# Patient Record
Sex: Male | Born: 1980 | Race: Black or African American | Hispanic: No | Marital: Single | Smoking: Current every day smoker
Health system: Southern US, Community
[De-identification: ages and names within clinical notes are randomized; demographics above are authoritative.]

## PROBLEM LIST (undated history)

## (undated) DIAGNOSIS — J309 Allergic rhinitis, unspecified: Secondary | ICD-10-CM

## (undated) HISTORY — DX: Allergic rhinitis, unspecified: J30.9

---

## 2004-08-04 ENCOUNTER — Encounter: Admission: RE | Admit: 2004-08-04 | Discharge: 2004-08-04 | Payer: Self-pay | Admitting: Family Medicine

## 2005-12-17 ENCOUNTER — Emergency Department: Payer: Self-pay | Admitting: Emergency Medicine

## 2007-05-02 ENCOUNTER — Emergency Department (HOSPITAL_COMMUNITY): Admission: EM | Admit: 2007-05-02 | Discharge: 2007-05-02 | Payer: Self-pay | Admitting: Emergency Medicine

## 2007-09-07 ENCOUNTER — Ambulatory Visit: Payer: Self-pay | Admitting: General Practice

## 2008-05-08 ENCOUNTER — Emergency Department (HOSPITAL_COMMUNITY): Admission: EM | Admit: 2008-05-08 | Discharge: 2008-05-08 | Payer: Self-pay | Admitting: Emergency Medicine

## 2010-04-12 HISTORY — PX: MINOR FULGERATION OF ANAL CONDYLOMA: SHX6467

## 2013-09-28 ENCOUNTER — Ambulatory Visit (INDEPENDENT_AMBULATORY_CARE_PROVIDER_SITE_OTHER): Payer: BC Managed Care – PPO | Admitting: Medical

## 2013-09-28 ENCOUNTER — Ambulatory Visit
Admission: RE | Admit: 2013-09-28 | Discharge: 2013-09-28 | Disposition: A | Discharge status: Home / Self Care | Payer: BC Managed Care – PPO | Source: Ambulatory Visit | Attending: Medical | Admitting: Medical

## 2013-09-28 ENCOUNTER — Encounter: Payer: Self-pay | Admitting: Medical

## 2013-09-28 VITALS — BP 110/80 | HR 68 | Temp 97.9°F | Resp 16 | Ht 69.0 in | Wt 175.0 lb

## 2013-09-28 DIAGNOSIS — B353 Tinea pedis: Secondary | ICD-10-CM

## 2013-09-28 DIAGNOSIS — L0889 Other specified local infections of the skin and subcutaneous tissue: Secondary | ICD-10-CM

## 2013-09-28 DIAGNOSIS — B351 Tinea unguium: Secondary | ICD-10-CM

## 2013-09-28 DIAGNOSIS — M79672 Pain in left foot: Secondary | ICD-10-CM

## 2013-09-28 DIAGNOSIS — M79609 Pain in unspecified limb: Secondary | ICD-10-CM

## 2013-09-28 DIAGNOSIS — M79671 Pain in right foot: Secondary | ICD-10-CM

## 2013-09-28 LAB — CBC WITH DIFFERENTIAL/PLATELET
Basophils Absolute: 0 10*3/uL (ref 0.0–0.1)
Eosinophils Absolute: 0 10*3/uL (ref 0.0–0.7)
HEMATOCRIT: 40 % (ref 39.0–52.0)
Hemoglobin: 13.4 g/dL (ref 13.0–17.0)
Lymphocytes Relative: 24 % (ref 12–46)
Lymphs Abs: 1.3 10*3/uL (ref 0.7–4.0)
MCH: 30.7 pg (ref 26.0–34.0)
MCHC: 33.5 g/dL (ref 30.0–36.0)
MCV: 91.7 fL (ref 78.0–100.0)
Monocytes Absolute: 0.3 10*3/uL (ref 0.1–1.0)
Monocytes Relative: 6 % (ref 3–12)
NEUTROS PCT: 70 % (ref 43–77)
Neutro Abs: 3.9 10*3/uL (ref 1.7–7.7)
PLATELETS: 199 10*3/uL (ref 150–400)
RBC: 4.36 MIL/uL (ref 4.22–5.81)
RDW: 12.9 % (ref 11.5–15.5)
WBC: 5.6 10*3/uL (ref 4.0–10.5)

## 2013-09-28 LAB — POCT CBG (FASTING - GLUCOSE)-MANUAL ENTRY: GLUCOSE FASTING, POC: 95 mg/dL (ref 70–99)

## 2013-09-28 MED ORDER — HYDROCODONE-ACETAMINOPHEN 5-325 MG PO TABS: 1.0000 | ORAL_TABLET | Freq: Four times a day (QID) | ORAL | Status: DC | PRN

## 2013-09-28 MED ORDER — CEFTRIAXONE SODIUM 1 G IJ SOLR
1.0000 g | Freq: Once | INTRAMUSCULAR | Status: AC
  Administered 2013-09-28: 1 g via INTRAMUSCULAR

## 2013-09-28 MED ORDER — CEPHALEXIN 500 MG PO CAPS: 500.0000 mg | ORAL_CAPSULE | Freq: Four times a day (QID) | ORAL | Status: DC

## 2013-09-28 MED ORDER — TERBINAFINE HCL 250 MG PO TABS: 250.0000 mg | ORAL_TABLET | Freq: Every day | ORAL | Status: DC

## 2013-09-28 NOTE — Patient Instructions (Signed)
  Thank you for giving me the opportunity to serve you today.    Your diagnosis today includes: Encounter Diagnoses  Name Primary?  . Secondary infection of skin Yes  . Tinea pedis of both feet   . Onychomycosis   . Foot pain, bilateral      Specific recommendations today include:  We gave you a shot of Rocephin antibiotic today  Begin Keflex oral antibiotic 4 times a day, however just take it 3 times a day starting at 4pm today since we gave you a shot of antibiotic in the office  Begin Lamisil oral tablets once daily for toenail and foot fungus  Continue Lamisil cream in between the toes and on the feet  Over the weekend elevate your feet and keep them out to air out.  Avoid sock and shoes this weekend other than flip flops around the house  Rest and stay off the feet this weekend  You may use Hydrocodone pain medication if needed.  Otherwise use OTC Ibuprofen for pain and inflammation  If over the weekend you have fever, multicolored toes, significantly worse pain or swelling of the toes, then go to the emergency department  Return pending xray, lab.

## 2013-09-28 NOTE — Progress Notes (Signed)
Subjective: New patient today.   Patient is a 33 y/o [place] presenting 1 week of bilateral progressively worsening sores on his feet. Initially, he noticed pain in both his feet and was limping since Wednesday of last week. He went to an Urgent Care on Friday and was prescribed an antifungal which has helped with itching around the sores but his pain, sores and swelling has persisted. He has taken Ibuprofen with some relief but has burning sensation, swelling in his toes (L>R), some discharge from open sores. Denies numbness or tingling, fevers, N/V, urinary changes, diarrhea, abdominal pain.  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  He denies ongoing prior athlete's feet infection, but has had some irritation between toes weeks before now.  No other aggravating or relieving factors. No other questions or concerns.  ROS as in subjective  Objective: BP 110/80  Pulse 68  Temp(Src) 97.9 F (36.6 C) (Oral)  Resp 16  Ht 5\' 9"  (1.753 m)  Wt 175 lb (79.379 kg)  BMI 25.83 kg/m2  Gen: wd, wn, nad Skin/Toes: There is quite severe maceration and white coloration between toes of both feet, bilateral great toes and left second and third toes with draining open wounds, some other visible superficial pus pockets, there is swelling of the left second toe, somewhat swollen appearing great toes, mild tenderness to touch with left second toe, but range of motion seems normal, no obvious felon, Pulses normal No ankle edema   Assessment: Encounter Diagnoses  Name Primary?  . Secondary infection of skin Yes  . Tinea pedis of both feet   . Onychomycosis   . Foot pain, bilateral      Plan: Will get stat labs, will send for feet xrays to rule out osteomyelitis of toes given the exam findings.  C/t lamisil cream topically,  good hygiene, begin Keflex QID, begin Lamisil 250mg  oral, 1 gm shot of Rocephin given IM in office, Hycodan prn pain, let feet air out, and use elevation of feet over the weekend.  Recheck Monday.  We will call with lab results in a few hours.

## 2013-10-01 ENCOUNTER — Ambulatory Visit (INDEPENDENT_AMBULATORY_CARE_PROVIDER_SITE_OTHER): Payer: BC Managed Care – PPO | Admitting: Medical

## 2013-10-01 ENCOUNTER — Encounter: Payer: Self-pay | Admitting: Medical

## 2013-10-01 VITALS — BP 100/70 | HR 72 | Temp 97.8°F | Resp 16 | Wt 169.0 lb

## 2013-10-01 DIAGNOSIS — M79672 Pain in left foot: Secondary | ICD-10-CM

## 2013-10-01 DIAGNOSIS — B353 Tinea pedis: Secondary | ICD-10-CM

## 2013-10-01 DIAGNOSIS — B351 Tinea unguium: Secondary | ICD-10-CM

## 2013-10-01 DIAGNOSIS — M79609 Pain in unspecified limb: Secondary | ICD-10-CM

## 2013-10-01 DIAGNOSIS — M79671 Pain in right foot: Secondary | ICD-10-CM

## 2013-10-01 NOTE — Progress Notes (Signed)
Subjective: Here for 3 day f/u.   We saw him as a new patient Friday for particularly bad tinea pedis, onychomycosis and secondary bacterial infection of the feet.  Over the weekend he let his feet air out, is taking the Keflex QID, Lamisil 250mg  oral daily, and the Lamisil topical cream.  We gave him 1g Rocephin Friday.  He presented Friday with 1 week of bilateral progressively worsening sores on his feet. Initially, he noticed pain in both his feet and was limping since Wednesday of last week. He went to an Urgent Care on Friday and was prescribed an antifungal which has helped with itching around the sores but his pain, sores and swelling has persisted. He has taken Ibuprofen with some relief but has burning sensation, swelling in his toes (L>R), some discharge from open sores. Denies numbness or tingling, fevers, N/V, urinary changes, diarrhea, abdominal pain.  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  He denies ongoing prior athlete's feet infection, but has had some irritation between toes weeks before now.  No other aggravating or relieving factors. No other questions or concerns.  ROS as in subjective  Objective: BP 100/70  Pulse 72  Temp(Src) 97.8 F (36.6 C) (Oral)  Resp 16  Wt 169 lb (76.658 kg)  Gen: wd, wn, nad Skin/Toes: There is quite severe maceration and white coloration between toes of both feet, bilateral great toes and left second and third toes with improved wounds, some other visible superficial pus pockets, there is less swelling of the left second toe, improved appearing great toes, nontender to touch today thought toes, range of motion seems normal, no obvious felon Pulses normal No ankle edema   Assessment: Encounter Diagnoses  Name Primary?  . Tinea pedis of both feet Yes  . Onychomycosis   .  Foot pain, bilateral      Plan: significant improvement compared to Friday.  C/t Keflex, c/t Lamisil oral and topical x several weeks.  recheck 34mo, sooner if not much improved by end of Keflex course.  discussed signs/symptom that would prompt earlier recheck.   C/t airing out the feet.  Avoid moisture in the shoes/socks.   He agrees and voices understanding of plan.

## 2015-01-25 IMAGING — CR DG FOOT 2V*R*
2 series · 2 of 2 positions shown · non-contrast
Comparison: None.

CLINICAL DATA: Pain.

EXAM:
RIGHT FOOT - 2 VIEW

[view not recorded (1 of 2)]
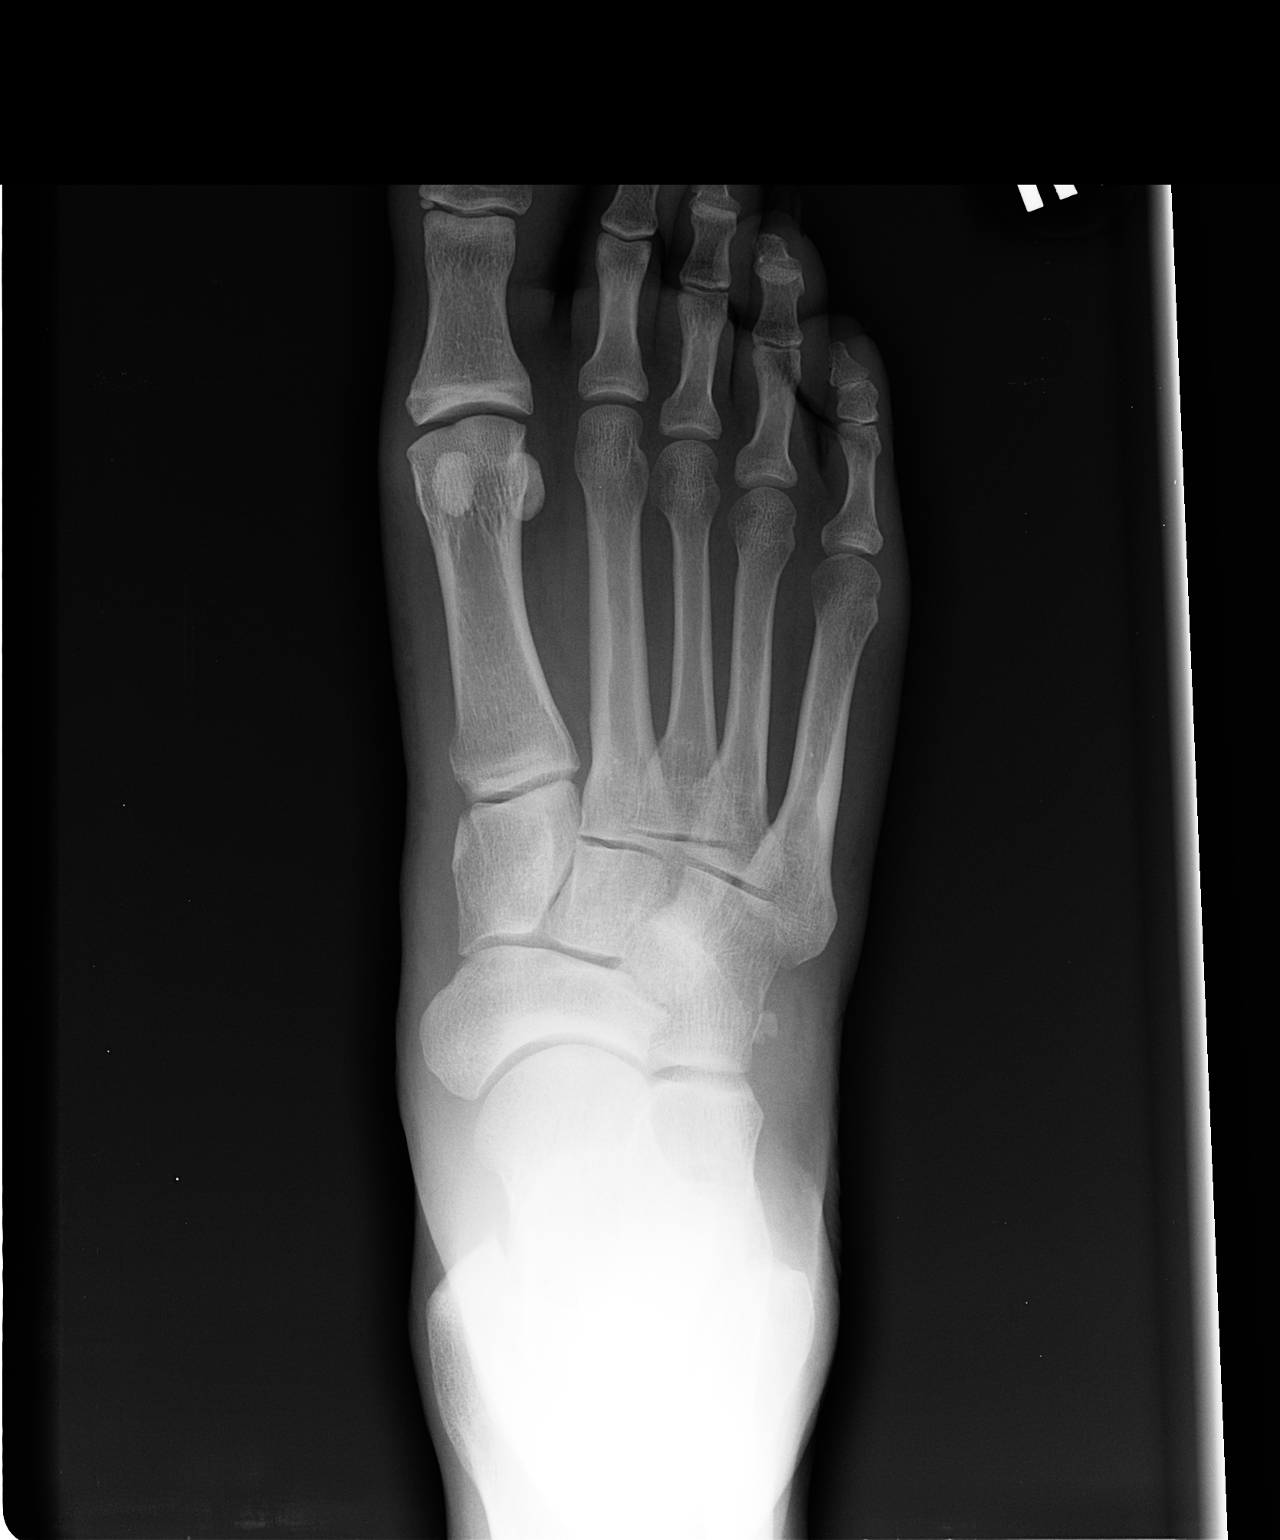

[view not recorded (2 of 2)]
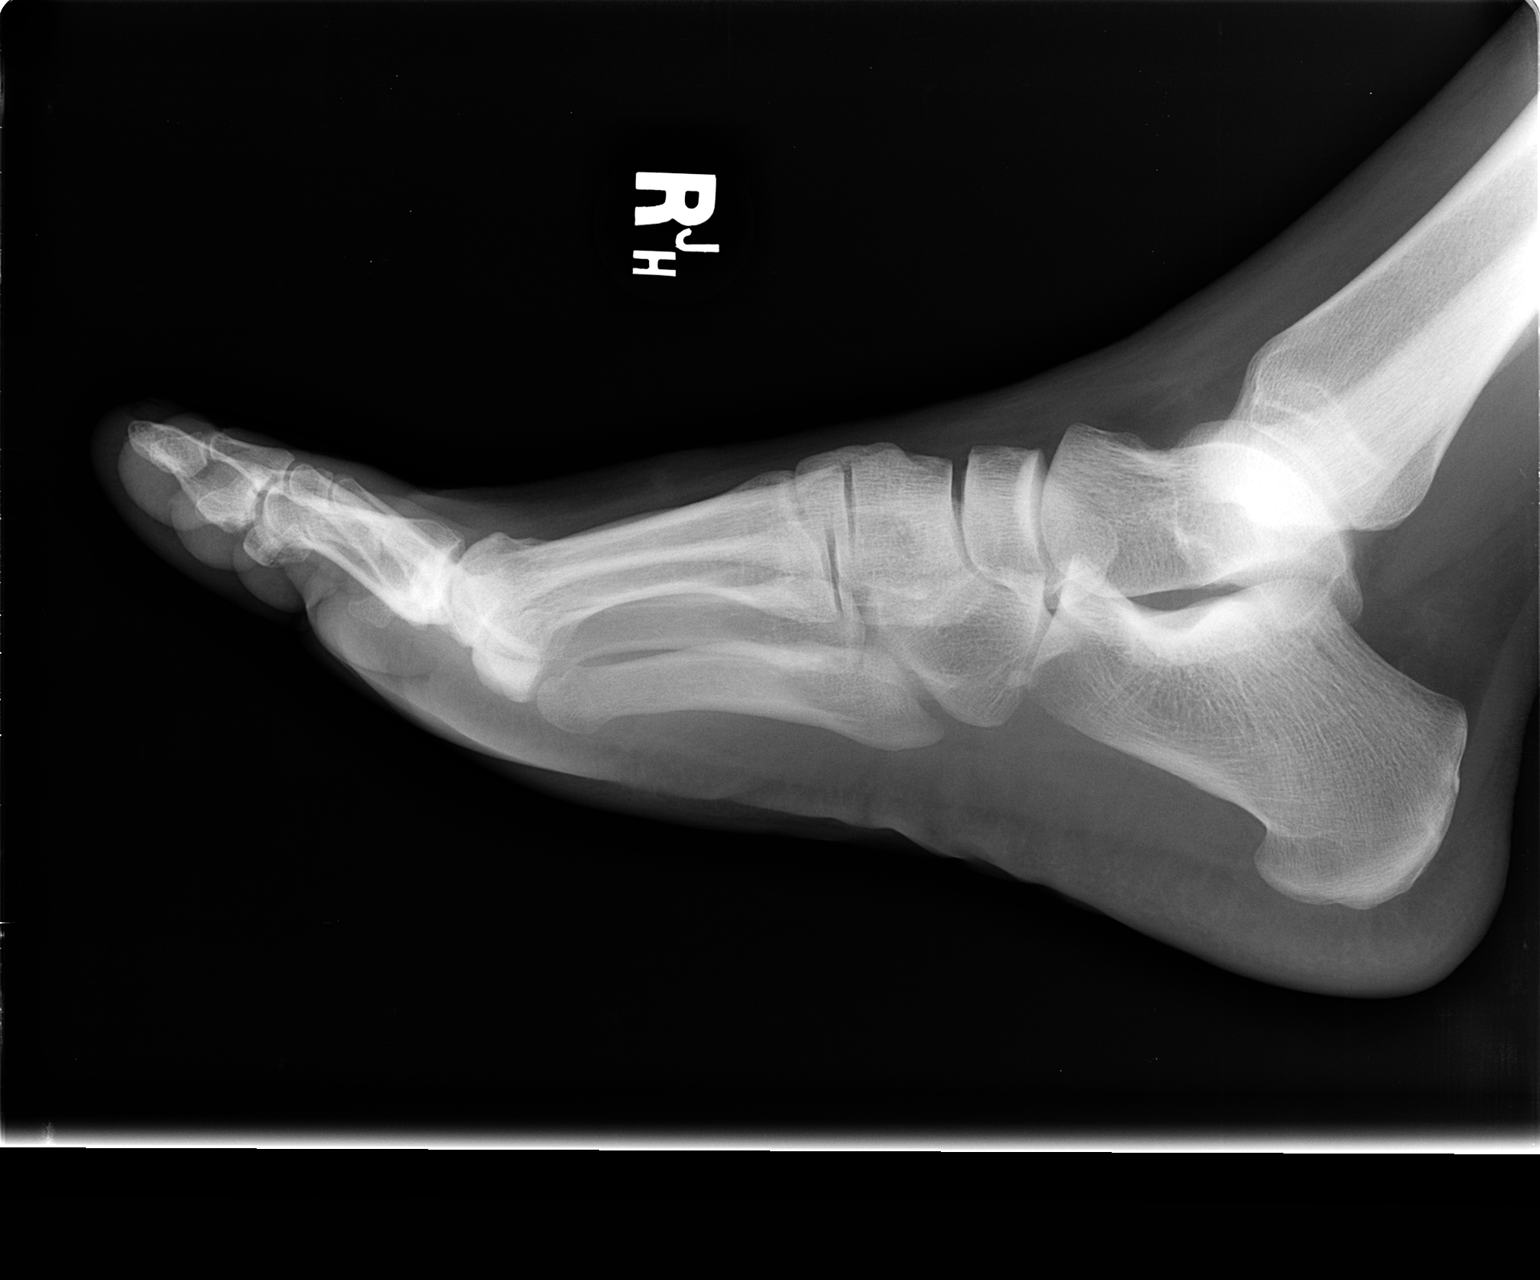

[2 of 2 positions shown; findings below may reference images not displayed]

FINDINGS: Soft tissue structures are unremarkable. No radiopaque foreign
bodies. No evidence of acute fracture or dislocation. No erosive
lesions. Sclerosis is noted of the right fifth metatarsal. This
suggests old healed stress fracture.
IMPRESSION: Sclerosis right fifth metatarsal. This suggest old healed stress
fracture. No acute abnormality.

## 2015-07-15 ENCOUNTER — Encounter (HOSPITAL_COMMUNITY): Payer: Self-pay | Admitting: Emergency Medicine

## 2015-07-15 ENCOUNTER — Emergency Department (HOSPITAL_COMMUNITY)
Admission: EM | Admit: 2015-07-15 | Discharge: 2015-07-15 | Disposition: A | Discharge status: Home / Self Care | Payer: No Typology Code available for payment source | Attending: Emergency Medicine | Admitting: Emergency Medicine

## 2015-07-15 DIAGNOSIS — Z79899 Other long term (current) drug therapy: Secondary | ICD-10-CM | POA: Diagnosis not present

## 2015-07-15 DIAGNOSIS — F17200 Nicotine dependence, unspecified, uncomplicated: Secondary | ICD-10-CM | POA: Insufficient documentation

## 2015-07-15 DIAGNOSIS — Y92410 Unspecified street and highway as the place of occurrence of the external cause: Secondary | ICD-10-CM | POA: Insufficient documentation

## 2015-07-15 DIAGNOSIS — Y9389 Activity, other specified: Secondary | ICD-10-CM | POA: Insufficient documentation

## 2015-07-15 DIAGNOSIS — S199XXA Unspecified injury of neck, initial encounter: Secondary | ICD-10-CM | POA: Diagnosis present

## 2015-07-15 DIAGNOSIS — Y998 Other external cause status: Secondary | ICD-10-CM | POA: Insufficient documentation

## 2015-07-15 DIAGNOSIS — S161XXA Strain of muscle, fascia and tendon at neck level, initial encounter: Secondary | ICD-10-CM | POA: Diagnosis not present

## 2015-07-15 DIAGNOSIS — S3992XA Unspecified injury of lower back, initial encounter: Secondary | ICD-10-CM | POA: Insufficient documentation

## 2015-07-15 DIAGNOSIS — Z792 Long term (current) use of antibiotics: Secondary | ICD-10-CM | POA: Insufficient documentation

## 2015-07-15 DIAGNOSIS — S0990XA Unspecified injury of head, initial encounter: Secondary | ICD-10-CM | POA: Insufficient documentation

## 2015-07-15 DIAGNOSIS — M549 Dorsalgia, unspecified: Secondary | ICD-10-CM

## 2015-07-15 MED ORDER — IBUPROFEN 800 MG PO TABS
800.0000 mg | ORAL_TABLET | Freq: Three times a day (TID) | ORAL | Status: DC
Start: 2015-07-15 — End: 2023-09-08

## 2015-07-15 MED ORDER — KETOROLAC TROMETHAMINE 60 MG/2ML IM SOLN
30.0000 mg | Freq: Once | INTRAMUSCULAR | Status: AC
  Administered 2015-07-15: 30 mg via INTRAMUSCULAR
  Filled 2015-07-15: qty 2

## 2015-07-15 MED ORDER — ACETAMINOPHEN 325 MG PO TABS
650.0000 mg | ORAL_TABLET | Freq: Once | ORAL | Status: AC
Start: 2015-07-15 — End: 2015-07-15
  Administered 2015-07-15: 650 mg via ORAL
  Filled 2015-07-15: qty 2

## 2015-07-15 MED ORDER — METHOCARBAMOL 500 MG PO TABS: 500.0000 mg | ORAL_TABLET | Freq: Two times a day (BID) | ORAL | Status: DC

## 2015-07-15 MED ORDER — ACETAMINOPHEN 325 MG PO TABS: 650.0000 mg | ORAL_TABLET | Freq: Four times a day (QID) | ORAL | Status: DC | PRN
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
## 2015-07-16 ENCOUNTER — Telehealth: Payer: Self-pay | Admitting: Emergency Medicine

## 2015-07-17 ENCOUNTER — Ambulatory Visit (INDEPENDENT_AMBULATORY_CARE_PROVIDER_SITE_OTHER): Payer: BLUE CROSS/BLUE SHIELD | Admitting: Medical

## 2015-07-17 ENCOUNTER — Encounter: Payer: Self-pay | Admitting: Medical

## 2015-07-17 DIAGNOSIS — M542 Cervicalgia: Secondary | ICD-10-CM | POA: Diagnosis not present

## 2015-07-17 DIAGNOSIS — R221 Localized swelling, mass and lump, neck: Secondary | ICD-10-CM

## 2015-07-17 DIAGNOSIS — S060X0D Concussion without loss of consciousness, subsequent encounter: Secondary | ICD-10-CM

## 2015-07-17 DIAGNOSIS — M545 Low back pain, unspecified: Secondary | ICD-10-CM
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
## 2015-07-17 NOTE — Patient Instructions (Signed)
Recommendations:  Your symptoms suggest a mild concussion  i recommend reduced work schedule for the next few days  We will get you into physical therapy  Continue Ibuprofen  at least twice daily, but up to 3 times daily if needed  Use the Robaxin/methocarbamol muscle relaxer only when you are at home, not during work hours.   Lets plan to see you back Monday for follow up primarily on concussion  Gradually resume activities as long as you are not having worse concussion symptoms such as worse headache, worse dizziness, worse fatigue or forgetfulness  If you develop new symptoms such as severe headache, vomiting, vision loss, hearing loss, numbness, then get checked out immediately  You currently don't have symptoms or signs that warrant head or neck imaging   Concussion, Adult A concussion, or closed-head injury, is a brain injury caused by a direct blow to the head or by a quick and sudden movement (jolt) of the head or neck. Concussions are usually not life-threatening. Even so, the effects of a concussion can be serious. If you have had a concussion before, you are more likely to experience concussion-like symptoms after a direct blow to the head.  CAUSES  Direct blow to the head, such as from running into another player during a soccer game, being hit in a fight, or hitting your head on a hard surface.  A jolt of the head or neck that causes the brain to move back and forth inside the skull, such as in a car crash. SIGNS AND SYMPTOMS The signs of a concussion can be hard to notice. Early on, they may be missed by you, family members, and health care providers. You may look fine but act or feel differently. Symptoms are usually temporary, but they may last for days, weeks, or even longer. Some symptoms may appear right away while others may not show up for hours or days. Every head injury is different. Symptoms include:  Mild to moderate headaches that will not go away.  A  feeling of pressure inside your head.  Having more trouble than usual:  Learning or remembering things you have heard.  Answering questions.  Paying attention or concentrating.  Organizing daily tasks.  Making decisions and solving problems.  Slowness in thinking, acting or reacting, speaking, or reading.  Getting lost or being easily confused.  Feeling tired all the time or lacking energy (fatigued).  Feeling drowsy.  Sleep disturbances.  Sleeping more than usual.  Sleeping less than usual.  Trouble falling asleep.  Trouble sleeping (insomnia).  Loss of balance or feeling lightheaded or dizzy.  Nausea or vomiting.  Numbness or tingling.  Increased sensitivity to:  Sounds.  Lights.  Distractions.  Vision problems or eyes that tire easily.  Diminished sense of taste or smell.  Ringing in the ears.  Mood changes such as feeling sad or anxious.  Becoming easily irritated or angry for little or no reason.  Lack of motivation.  Seeing or hearing things other people do not see or hear (hallucinations). DIAGNOSIS Your health care provider can usually diagnose a concussion based on a description of your injury and symptoms. He or she will ask whether you passed out (lost consciousness) and whether you are having trouble remembering events that happened right before and during your injury. Your evaluation might include:  A brain scan to look for signs of injury to the brain. Even if the test shows no injury, you may still have a concussion.  Blood tests  to be sure other problems are not present. TREATMENT  Concussions are usually treated in an emergency department, in urgent care, or at a clinic. You may need to stay in the hospital overnight for further treatment.  Tell your health care provider if you are taking any medicines, including prescription medicines, over-the-counter medicines, and natural remedies. Some medicines, such as blood thinners  (anticoagulants) and aspirin, may increase the chance of complications. Also tell your health care provider whether you have had alcohol or are taking illegal drugs. This information may affect treatment.  Your health care provider will send you home with important instructions to follow.  How fast you will recover from a concussion depends on many factors. These factors include how severe your concussion is, what part of your brain was injured, your age, and how healthy you were before the concussion.  Most people with mild injuries recover fully. Recovery can take time. In general, recovery is slower in older persons. Also, persons who have had a concussion in the past or have other medical problems may find that it takes longer to recover from their current injury. HOME CARE INSTRUCTIONS General Instructions  Carefully follow the directions your health care provider gave you.  Only take over-the-counter or prescription medicines for pain, discomfort, or fever as directed by your health care provider.  Take only those medicines that your health care provider has approved.  Do not drink alcohol until your health care provider says you are well enough to do so. Alcohol and certain other drugs may slow your recovery and can put you at risk of further injury.  If it is harder than usual to remember things, write them down.  If you are easily distracted, try to do one thing at a time. For example, do not try to watch TV while fixing dinner.  Talk with family members or close friends when making important decisions.  Keep all follow-up appointments. Repeated evaluation of your symptoms is recommended for your recovery.  Watch your symptoms and tell others to do the same. Complications sometimes occur after a concussion. Older adults with a brain injury may have a higher risk of serious complications, such as a blood clot on the brain.  Tell your teachers, school nurse, school counselor,  coach, athletic trainer, or work Production designer, theatre/television/filmmanager about your injury, symptoms, and restrictions. Tell them about what you can or cannot do. They should watch for:  Increased problems with attention or concentration.  Increased difficulty remembering or learning new information.  Increased time needed to complete tasks or assignments.  Increased irritability or decreased ability to cope with stress.  Increased symptoms.  Rest. Rest helps the brain to heal. Make sure you:  Get plenty of sleep at night. Avoid staying up late at night.  Keep the same bedtime hours on weekends and weekdays.  Rest during the day. Take daytime naps or rest breaks when you feel tired.  Limit activities that require a lot of thought or concentration. These include:  Doing homework or job-related work.  Watching TV.  Working on the computer.  Avoid any situation where there is potential for another head injury (football, hockey, soccer, basketball, martial arts, downhill snow sports and horseback riding). Your condition will get worse every time you experience a concussion. You should avoid these activities until you are evaluated by the appropriate follow-up health care providers. Returning To Your Regular Activities You will need to return to your normal activities slowly, not all at once. You must give  your body and brain enough time for recovery.  Do not return to sports or other athletic activities until your health care provider tells you it is safe to do so.  Ask your health care provider when you can drive, ride a bicycle, or operate heavy machinery. Your ability to react may be slower after a brain injury. Never do these activities if you are dizzy.  Ask your health care provider about when you can return to work or school. Preventing Another Concussion It is very important to avoid another brain injury, especially before you have recovered. In rare cases, another injury can lead to permanent brain  damage, brain swelling, or death. The risk of this is greatest during the first 7-10 days after a head injury. Avoid injuries by:  Wearing a seat belt when riding in a car.  Drinking alcohol only in moderation.  Wearing a helmet when biking, skiing, skateboarding, skating, or doing similar activities.  Avoiding activities that could lead to a second concussion, such as contact or recreational sports, until your health care provider says it is okay.  Taking safety measures in your home.  Remove clutter and tripping hazards from floors and stairways.  Use grab bars in bathrooms and handrails by stairs.  Place non-slip mats on floors and in bathtubs.  Improve lighting in dim areas. SEEK MEDICAL CARE IF:  You have increased problems paying attention or concentrating.  You have increased difficulty remembering or learning new information.  You need more time to complete tasks or assignments than before.  You have increased irritability or decreased ability to cope with stress.  You have more symptoms than before. Seek medical care if you have any of the following symptoms for more than 2 weeks after your injury:  Lasting (chronic) headaches.  Dizziness or balance problems.  Nausea.  Vision problems.  Increased sensitivity to noise or light.  Depression or mood swings.  Anxiety or irritability.  Memory problems.  Difficulty concentrating or paying attention.  Sleep problems.  Feeling tired all the time. SEEK IMMEDIATE MEDICAL CARE IF:  You have severe or worsening headaches. These may be a sign of a blood clot in the brain.  You have weakness (even if only in one hand, leg, or part of the face).  You have numbness.  You have decreased coordination.  You vomit repeatedly.  You have increased sleepiness.  One pupil is larger than the other.  You have convulsions.  You have slurred speech.  You have increased confusion. This may be a sign of a blood  clot in the brain.  You have increased restlessness, agitation, or irritability.  You are unable to recognize people or places.  You have neck pain.  It is difficult to wake you up.  You have unusual behavior changes.  You lose consciousness. MAKE SURE YOU:  Understand these instructions.  Will watch your condition.  Will get help right away if you are not doing well or get worse.   This information is not intended to replace advice given to you by your health care provider. Make sure you discuss any questions you have with your health care provider.   Document Released: 06/19/2003 Document Revised: 04/19/2014 Document Reviewed: 10/19/2012 Elsevier Interactive Patient Education [employer]

## 2015-07-22 ENCOUNTER — Ambulatory Visit (INDEPENDENT_AMBULATORY_CARE_PROVIDER_SITE_OTHER): Payer: BLUE CROSS/BLUE SHIELD | Admitting: Family Medicine

## 2015-07-22 ENCOUNTER — Telehealth: Payer: Self-pay

## 2015-07-22 ENCOUNTER — Encounter: Payer: Self-pay | Admitting: Family Medicine

## 2015-07-22 DIAGNOSIS — S060X0D Concussion without loss of consciousness, subsequent encounter: Secondary | ICD-10-CM | POA: Diagnosis not present

## 2015-07-22 DIAGNOSIS — M545 Low back pain: Secondary | ICD-10-CM | POA: Diagnosis not present

## 2015-07-22 NOTE — Patient Instructions (Signed)
Go to physical therapy as scheduled this Thursday. Continue taking Ibuprofen and using heat to neck and back as needed. You may continue taking Robaxin before bed if needed but use this less and less as tolerated. Follow up with Vincenza HewsShane, PCP, approximately 2-3 weeks after starting physical therapy to ensure full recovery.
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
## 2015-07-22 NOTE — Telephone Encounter (Signed)
Return to work  

## 2015-09-03 ENCOUNTER — Telehealth: Payer: Self-pay

## 2015-09-03 NOTE — Telephone Encounter (Signed)
[facility] to [facility] plan, is that okay?

## 2015-09-03 NOTE — Telephone Encounter (Signed)
That is fine 

## 2019-08-16 ENCOUNTER — Ambulatory Visit: Payer: Self-pay | Attending: Internal Medicine

## 2019-08-16 DIAGNOSIS — Z23 Encounter for immunization: Secondary | ICD-10-CM

## 2019-08-16 NOTE — Progress Notes (Signed)
   Covid-19 Vaccination Clinic  Name:  Nicholas Bullock    MRN: [id number] DOB: [birth year]  08/16/2019  [name] was observed post Covid-19 immunization for 15 minutes without incident. He was provided with Vaccine Information Sheet and instruction to access the V-Safe system.   [name] was instructed to call 911 with any severe reactions post vaccine: Marland Kitchen Difficulty breathing  . Swelling of face and throat  . A fast heartbeat  . A bad rash all over body  . Dizziness and weakness   Immunizations Administered    Name Date Dose VIS Date Route   Moderna COVID-19 Vaccine 08/16/2019  3:06 PM 0.5 mL 03/2019 Intramuscular   Manufacturer: Gala Murdoch   Lot: [id number]   NDC: [id number]

## 2019-09-13 ENCOUNTER — Ambulatory Visit: Payer: Self-pay | Attending: Internal Medicine

## 2019-09-13 DIAGNOSIS — Z23 Encounter for immunization: Secondary | ICD-10-CM

## 2019-09-13 NOTE — Progress Notes (Signed)
° °  Covid-19 Vaccination Clinic  Name:  Nicholas Bullock    MRN: [id number] DOB: [birth year]  09/13/2019  Mr. Klauer was observed post Covid-19 immunization for 15 minutes without incident. He was provided with Vaccine Information Sheet and instruction to access the V-Safe system.   Mr. Shaneyfelt was instructed to call 911 with any severe reactions post vaccine:  Difficulty breathing   Swelling of face and throat   A fast heartbeat   A bad rash all over body   Dizziness and weakness   Immunizations Administered    Name Date Dose VIS Date Route   Moderna COVID-19 Vaccine 09/13/2019  3:09 PM 0.5 mL 03/2019 Intramuscular   Manufacturer: Moderna   Lot: [id number]   NDC: 78718-367-25

## 2022-06-10 DIAGNOSIS — H1045 Other chronic allergic conjunctivitis: Secondary | ICD-10-CM | POA: Diagnosis not present

## 2022-06-10 DIAGNOSIS — J301 Allergic rhinitis due to pollen: Secondary | ICD-10-CM | POA: Diagnosis not present

## 2022-06-10 DIAGNOSIS — J3089 Other allergic rhinitis: Secondary | ICD-10-CM | POA: Diagnosis not present

## 2023-09-08 ENCOUNTER — Encounter: Payer: Self-pay | Admitting: Medical

## 2023-09-08 ENCOUNTER — Ambulatory Visit (INDEPENDENT_AMBULATORY_CARE_PROVIDER_SITE_OTHER): Payer: Self-pay | Admitting: Medical

## 2023-09-08 VITALS — BP 120/72 | HR 89 | Ht 69.0 in | Wt 167.6 lb

## 2023-09-08 DIAGNOSIS — Z720 Tobacco use: Secondary | ICD-10-CM | POA: Insufficient documentation

## 2023-09-08 DIAGNOSIS — L723 Sebaceous cyst: Secondary | ICD-10-CM | POA: Insufficient documentation

## 2023-09-08 DIAGNOSIS — Z23 Encounter for immunization: Secondary | ICD-10-CM

## 2023-09-08 DIAGNOSIS — Z1389 Encounter for screening for other disorder: Secondary | ICD-10-CM

## 2023-09-08 DIAGNOSIS — Z7184 Encounter for health counseling related to travel: Secondary | ICD-10-CM

## 2023-09-08 DIAGNOSIS — Z7185 Encounter for immunization safety counseling: Secondary | ICD-10-CM | POA: Insufficient documentation

## 2023-09-08 DIAGNOSIS — Z0000 Encounter for general adult medical examination without abnormal findings: Secondary | ICD-10-CM | POA: Diagnosis not present

## 2023-09-08 DIAGNOSIS — Z139 Encounter for screening, unspecified: Secondary | ICD-10-CM | POA: Diagnosis not present

## 2023-09-08 DIAGNOSIS — Z2989 Encounter for other specified prophylactic measures: Secondary | ICD-10-CM

## 2023-09-08 MED ORDER — ATOVAQUONE-PROGUANIL HCL 250-100 MG PO TABS
1.0000 | ORAL_TABLET | Freq: Every day | ORAL | 0 refills | Status: AC
Start: 2023-09-08 — End: ?

## 2023-09-08 NOTE — Patient Instructions (Signed)
 This visit was a preventative care visit, also known as wellness visit or routine physical.   Topics typically include healthy lifestyle, diet, exercise, preventative care, vaccinations, sick and well care, proper use of emergency dept and after hours care, as well as other concerns.     Separate significant issues discussed: Tobacco use - advised cessation  Sebaceous cyst - benign, reassured,  dicussed possible complications  Travel advice - lab titers today.  Discussed travel to [place], [place], safety, vaccines.  Discussed safe water drinking.   Updated Tdap and Hep A vaccines today.  Begin malaria prophylaxis 2 days prior to trip, daily during trip, and 7 days post trip.    General Recommendations: Continue to return yearly for your annual wellness and preventative care visits.  This gives us  a chance to discuss healthy lifestyle, exercise, vaccinations, review your chart record, and perform screenings where appropriate.  I recommend you see your eye doctor yearly for routine vision care.  I recommend you see your dentist yearly for routine dental care including hygiene visits twice yearly.   Vaccination  Immunization History  Administered Date(s) Administered   Moderna Sars-Covid-2 Vaccination 08/16/2019, 09/13/2019, 04/10/2020   Yellow Fever 08/05/2023    Vaccine recommendations: Yearly flu shot Tdap Hep A  Vaccines administered today: Counseled on the Tdap (tetanus, diptheria, and acellular pertussis) vaccine.  Vaccine information sheet given. Tdap vaccine given after consent obtained.  Counseled on the Hepatitis A virus vaccine.  Vaccine information sheet given.  Hepatitis A vaccine given after consent obtained.  Patient was advised to return in 6 months for Hep A #2.     Screening for cancer: Colon cancer screening: Age 43  Testicular cancer screening You should do a monthly self testicular exam if you are between 43-48 years old, and we typically do a testicular  exam on the yearly physical for this same age group.   Prostate Cancer screening: The recommended prostate cancer screening test is a blood test called the prostate-specific antigen (PSA) test. PSA is a protein that is made in the prostate. As you age, your prostate naturally produces more PSA. Abnormally high PSA levels may be caused by: Prostate cancer. An enlarged prostate that is not caused by cancer (benign prostatic hyperplasia, or BPH). This condition is very common in older men. A prostate gland infection (prostatitis) or urinary tract infection. Certain medicines such as male hormones (like testosterone) or other medicines that raise testosterone levels. A rectal exam may be done as part of prostate cancer screening to help provide information about the size of your prostate gland. When a rectal exam is performed, it should be done after the PSA level is drawn to avoid any effect on the results.   Skin cancer screening: Check your skin regularly for new changes, growing lesions, or other lesions of concern Come in for evaluation if you have skin lesions of concern.   Lung cancer screening: If you have a greater than 20 pack year history of tobacco use, then you may qualify for lung cancer screening with a chest CT scan.   Please call your insurance company to inquire about coverage for this test.   Pancreatic cancer:  no current screening test is available or routinely recommended. (risk factors: smoking, overweight or obese, diabetes, chronic pancreatitis, work exposure - dry cleaning, metal working, 43yo>, M>F, Tree surgeon, family hx/o, hereditary breast, ovarian, melanoma, lynch, peutz-jeghers).  Symptoms: jaundice, dark urine, light color or greasy stools, itchy skin, belly or back pain, weight loss,  poor appetite, nausea, vomiting, liver enlargement, DVT/blood clots.   We currently don't have screenings for other cancers besides breast, cervical, colon, and lung cancers.   If you have a strong family history of cancer or have other cancer screening concerns, please let me know.  Genetic testing referral is an option for individuals with high cancer risk in the family.  There are some other cancer screenings in development currently.   Bone health: Get at least 150 minutes of aerobic exercise weekly Get weight bearing exercise at least once weekly Bone density test:  A bone density test is an imaging test that uses a type of X-ray to measure the amount of calcium and other minerals in your bones. The test may be used to diagnose or screen you for a condition that causes weak or thin bones (osteoporosis), predict your risk for a broken bone (fracture), or determine how well your osteoporosis treatment is working. The bone density test is recommended for females 65 and older, or females or males <65 if certain risk factors such as thyroid disease, long term use of steroids such as for asthma or rheumatological issues, vitamin D deficiency, estrogen deficiency, family history of osteoporosis, self or family history of fragility fracture in first degree relative.    Heart health: Get at least 150 minutes of aerobic exercise weekly Limit alcohol It is important to maintain a healthy blood pressure and healthy cholesterol numbers  Heart disease screening: Screening for heart disease includes screening for blood pressure, fasting lipids, glucose/diabetes screening, BMI height to weight ratio, reviewed of smoking status, physical activity, and diet.    Goals include blood pressure 120/80 or less, maintaining a healthy lipid/cholesterol profile, preventing diabetes or keeping diabetes numbers under good control, not smoking or using tobacco products, exercising most days per week or at least 150 minutes per week of exercise, and eating healthy variety of fruits and vegetables, healthy oils, and avoiding unhealthy food choices like fried food, fast food, high sugar and high  cholesterol foods.    Other tests may possibly include EKG test, CT coronary calcium score, echocardiogram, exercise treadmill stress test.      Vascular disease screening: For higher risk individuals including smokers, diabetics, patients with known heart disease or high blood pressure, kidney disease, and others, screening for vascular disease or atherosclerosis of the arteries is available.  Examples may include carotid ultrasound, abdominal aortic ultrasound, ABI blood flow screening in the legs, thoracic aorta screening.    Medical care options: I recommend you continue to seek care here first for routine care.  We try really hard to have available appointments Monday through Friday daytime hours for sick visits, acute visits, and physicals.  Urgent care should be used for after hours and weekends for significant issues that cannot wait till the next day.  The emergency department should be used for significant potentially life-threatening emergencies.  The emergency department is expensive, can often have long wait times for less significant concerns, so try to utilize primary care, urgent care, or telemedicine when possible to avoid unnecessary trips to the emergency department.  Virtual visits and telemedicine have been introduced since the pandemic started in 2020, and can be convenient ways to receive medical care.  We offer virtual appointments as well to assist you in a variety of options to seek medical care.   Legal  Take the time to do a last will and testament, Advanced Directives including Health Care Power of Attorney and Living Will documents.  Don't leave your family with burdens that can be handled ahead of time.   Advanced Directives: I recommend you consider completing a Health Care Power of Attorney and Living Will.   These documents respect your wishes and help alleviate burdens on your loved ones if you were to become terminally ill or be in a position to need those  documents enforced.    You can complete Advanced Directives yourself, have them notarized, then have copies made for our office, for you and for anybody you feel should have them in safe keeping.  Or, you can have an attorney prepare these documents.   If you haven't updated your Last Will and Testament in a while, it may be worthwhile having an attorney prepare these documents together and save on some costs.       Spiritual and Emotional Health Keeping a healthy spiritual life can help you better manage your physical health. Your spiritual life can help you to cope with any issues that may arise with your physical health.  Balance can keep us  healthy and help us  to recover.  If you are struggling with your spiritual health there are questions that you may want to ask yourself:  What makes me feel most complete? When do I feel most connected to the rest of the world? Where do I find the most inner strength? What am I doing when I feel whole?  Helpful tips: Being in nature. Some people feel very connected and at peace when they are walking outdoors or are outside. Helping others. Some feel the largest sense of wellbeing when they are of service to others. Being of service can take on many forms. It can be doing volunteer work, being kind to strangers, or offering a hand to a friend in need. Gratitude. Some people find they feel the most connected when they remain grateful. They may make lists of all the things they are grateful for or say a thank you out loud for all they have.    Emotional Health Are you in tune with your emotional health?  Check out this link: http://www.marquez-love.com/    Financial Health Make sure you use a budget for your personal finances Make sure you are insured against risks (health insurance, life insurance, auto insurance, etc) Save more, spend less Set financial goals If you need help in this area, good resources include counseling through  [employer] or other community resources, have a meeting with a Social research officer, government, and a good resource is the [employer]

## 2023-09-08 NOTE — Progress Notes (Signed)
 Subjective:   HPI  Nicholas Bullock is a 43 y.o. male who presents for Chief Complaint  Patient presents with   New Patient (Initial Visit)    New/ old patient. Last visit was in 2017 here. Has a bump on back that he wants looked at. No other concerns    Patient Care Team: Jalecia Leon, Christiane Cowing, PA-C as PCP - General (Family Medicine)   Concerns: Here to re-establish care.   Here for well visit  Likes to travel.  Traveling to [place] and [place] in mid June.  Recently had Yellow fever vaccine a month ago.   Has a bump on right upper back for over a year, wants it looked at it, but not having any issues with it.    Reviewed their medical, surgical, family, social, medication, and allergy history and updated chart as appropriate.  No Known Allergies  Past Medical History:  Diagnosis Date   Allergic rhinitis     Current Outpatient Medications on File Prior to Visit  Medication Sig Dispense Refill   fexofenadine (ALLEGRA) 180 MG tablet Take 180 mg by mouth daily.     No current facility-administered medications on file prior to visit.     Current Outpatient Medications:    atovaquone-proguanil (MALARONE) 250-100 MG TABS tablet, Take 1 tablet by mouth daily. Begin 2 days prior to trip, use daily, continue for 7 days after trip, Disp: 30 tablet, Rfl: 0   fexofenadine (ALLEGRA) 180 MG tablet, Take 180 mg by mouth daily., Disp: , Rfl:   Family History  Problem Relation Age of Onset   Migraines Mother    Hypertension Maternal Uncle    Diabetes Paternal Aunt    Cancer Cousin    Cancer Cousin    Stroke Neg Hx    Heart disease Neg Hx     Past Surgical History:  Procedure Laterality Date   MINOR FULGERATION OF ANAL CONDYLOMA  2012     Review of Systems  Constitutional:  Negative for chills, fever, malaise/fatigue and weight loss.  HENT:  Negative for congestion, ear pain, hearing loss, sore throat and tinnitus.   Eyes:  Negative for blurred vision, pain and redness.   Respiratory:  Negative for cough, hemoptysis and shortness of breath.   Cardiovascular:  Negative for chest pain, palpitations, orthopnea, claudication and leg swelling.  Gastrointestinal:  Negative for abdominal pain, blood in stool, constipation, diarrhea, nausea and vomiting.  Genitourinary:  Negative for dysuria, flank pain, frequency, hematuria and urgency.  Musculoskeletal:  Negative for falls, joint pain and myalgias.  Skin:  Negative for itching and rash.  Neurological:  Negative for dizziness, tingling, speech change, weakness and headaches.  Endo/Heme/Allergies:  Negative for polydipsia. Does not bruise/bleed easily.  Psychiatric/Behavioral:  Negative for depression and memory loss. The patient is not nervous/anxious and does not have insomnia.       Objective:  BP 120/72   Pulse 89   Ht 5\' 9"  (1.753 m)   Wt 167 lb 9.6 oz (76 kg)   SpO2 98%   BMI 24.75 kg/m   General appearance: alert, no distress, WD/WN, African American male Skin: right upper back with subcutaneous mobile mass 2.5cm diameter c/w sebaceous cyst, tattoo across upper back, no worrisome lesions HEENT: normocephalic, conjunctiva/corneas normal, sclerae anicteric, PERRLA, EOMi, nares patent, no discharge or erythema, pharynx normal Oral cavity: MMM, tongue normal, teeth normal Neck: supple, no lymphadenopathy, no thyromegaly, no masses, normal ROM, no bruits Chest: non tender, normal shape and expansion Heart: RRR,  normal S1, S2, no murmurs Lungs: CTA bilaterally, no wheezes, rhonchi, or rales Abdomen: +bs, soft, non tender, non distended, no masses, no hepatomegaly, no splenomegaly, no bruits Back: non tender, normal ROM, no scoliosis Musculoskeletal: upper extremities non tender, no obvious deformity, normal ROM throughout, lower extremities non tender, no obvious deformity, normal ROM throughout Extremities: no edema, no cyanosis, no clubbing Pulses: 2+ symmetric, upper and lower extremities, normal cap  refill Neurological: alert, oriented x 3, CN2-12 intact, strength normal upper extremities and lower extremities, sensation normal throughout, DTRs 2+ throughout, no cerebellar signs, gait normal Psychiatric: normal affect, behavior normal, pleasant  GU: normal male external genitalia,circumcised, right superior spermatocele, otherwise nontender, no masses, no hernia, no lymphadenopathy Rectal: deferred    Assessment and Plan :   Encounter Diagnoses  Name Primary?   Encounter for health maintenance examination in adult Yes   Vaccine counseling    Screening for hematuria or proteinuria    Travel advice encounter    Screening for condition    Need for Tdap vaccination    Need for hepatitis A immunization    Need for malaria prophylaxis    Sebaceous cyst    Tobacco use     This visit was a preventative care visit, also known as wellness visit or routine physical.   Topics typically include healthy lifestyle, diet, exercise, preventative care, vaccinations, sick and well care, proper use of emergency dept and after hours care, as well as other concerns.     Separate significant issues discussed: Tobacco use - advised cessation  Sebaceous cyst - benign, reassured,  dicussed possible complications  Travel advice - lab titers today.  Discussed travel to [place], [place], safety, vaccines.  Discussed safe water drinking.   Updated Tdap and Hep A vaccines today.  Begin malaria prophylaxis 2 days prior to trip, daily during trip, and 7 days post trip.    General Recommendations: Continue to return yearly for your annual wellness and preventative care visits.  This gives us  a chance to discuss healthy lifestyle, exercise, vaccinations, review your chart record, and perform screenings where appropriate.  I recommend you see your eye doctor yearly for routine vision care.  I recommend you see your dentist yearly for routine dental care including hygiene visits twice yearly.   Vaccination   Immunization History  Administered Date(s) Administered   Hepatitis A, Adult 09/08/2023   Moderna Sars-Covid-2 Vaccination 08/16/2019, 09/13/2019, 04/10/2020   Tdap 09/08/2023   Yellow Fever 08/05/2023    Vaccine recommendations: Yearly flu shot Tdap Hep A  Vaccines administered today: Counseled on the Tdap (tetanus, diptheria, and acellular pertussis) vaccine.  Vaccine information sheet given. Tdap vaccine given after consent obtained.  Counseled on the Hepatitis A virus vaccine.  Vaccine information sheet given.  Hepatitis A vaccine given after consent obtained.  Patient was advised to return in 6 months for Hep A #2.     Screening for cancer: Colon cancer screening: Age 65  Testicular cancer screening You should do a monthly self testicular exam if you are between 58-30 years old, and we typically do a testicular exam on the yearly physical for this same age group.   Prostate Cancer screening: The recommended prostate cancer screening test is a blood test called the prostate-specific antigen (PSA) test. PSA is a protein that is made in the prostate. As you age, your prostate naturally produces more PSA. Abnormally high PSA levels may be caused by: Prostate cancer. An enlarged prostate that is not caused by cancer (  benign prostatic hyperplasia, or BPH). This condition is very common in older men. A prostate gland infection (prostatitis) or urinary tract infection. Certain medicines such as male hormones (like testosterone) or other medicines that raise testosterone levels. A rectal exam may be done as part of prostate cancer screening to help provide information about the size of your prostate gland. When a rectal exam is performed, it should be done after the PSA level is drawn to avoid any effect on the results.   Skin cancer screening: Check your skin regularly for new changes, growing lesions, or other lesions of concern Come in for evaluation if you have skin lesions  of concern.   Lung cancer screening: If you have a greater than 20 pack year history of tobacco use, then you may qualify for lung cancer screening with a chest CT scan.   Please call your insurance company to inquire about coverage for this test.   Pancreatic cancer:  no current screening test is available or routinely recommended. (risk factors: smoking, overweight or obese, diabetes, chronic pancreatitis, work exposure - dry cleaning, metal working, 43yo>, M>F, Tree surgeon, family hx/o, hereditary breast, ovarian, melanoma, lynch, peutz-jeghers).  Symptoms: jaundice, dark urine, light color or greasy stools, itchy skin, belly or back pain, weight loss, poor appetite, nausea, vomiting, liver enlargement, DVT/blood clots.   We currently don't have screenings for other cancers besides breast, cervical, colon, and lung cancers.  If you have a strong family history of cancer or have other cancer screening concerns, please let me know.  Genetic testing referral is an option for individuals with high cancer risk in the family.  There are some other cancer screenings in development currently.   Bone health: Get at least 150 minutes of aerobic exercise weekly Get weight bearing exercise at least once weekly Bone density test:  A bone density test is an imaging test that uses a type of X-ray to measure the amount of calcium and other minerals in your bones. The test may be used to diagnose or screen you for a condition that causes weak or thin bones (osteoporosis), predict your risk for a broken bone (fracture), or determine how well your osteoporosis treatment is working. The bone density test is recommended for females 65 and older, or females or males <65 if certain risk factors such as thyroid disease, long term use of steroids such as for asthma or rheumatological issues, vitamin D deficiency, estrogen deficiency, family history of osteoporosis, self or family history of fragility fracture in  first degree relative.    Heart health: Get at least 150 minutes of aerobic exercise weekly Limit alcohol It is important to maintain a healthy blood pressure and healthy cholesterol numbers  Heart disease screening: Screening for heart disease includes screening for blood pressure, fasting lipids, glucose/diabetes screening, BMI height to weight ratio, reviewed of smoking status, physical activity, and diet.    Goals include blood pressure 120/80 or less, maintaining a healthy lipid/cholesterol profile, preventing diabetes or keeping diabetes numbers under good control, not smoking or using tobacco products, exercising most days per week or at least 150 minutes per week of exercise, and eating healthy variety of fruits and vegetables, healthy oils, and avoiding unhealthy food choices like fried food, fast food, high sugar and high cholesterol foods.    Other tests may possibly include EKG test, CT coronary calcium score, echocardiogram, exercise treadmill stress test.      Vascular disease screening: For higher risk individuals including smokers, diabetics, patients with  known heart disease or high blood pressure, kidney disease, and others, screening for vascular disease or atherosclerosis of the arteries is available.  Examples may include carotid ultrasound, abdominal aortic ultrasound, ABI blood flow screening in the legs, thoracic aorta screening.    Medical care options: I recommend you continue to seek care here first for routine care.  We try really hard to have available appointments Monday through Friday daytime hours for sick visits, acute visits, and physicals.  Urgent care should be used for after hours and weekends for significant issues that cannot wait till the next day.  The emergency department should be used for significant potentially life-threatening emergencies.  The emergency department is expensive, can often have long wait times for less significant concerns, so try  to utilize primary care, urgent care, or telemedicine when possible to avoid unnecessary trips to the emergency department.  Virtual visits and telemedicine have been introduced since the pandemic started in 2020, and can be convenient ways to receive medical care.  We offer virtual appointments as well to assist you in a variety of options to seek medical care.   Legal  Take the time to do a last will and testament, Advanced Directives including Health Care Power of Attorney and Living Will documents.  Don't leave your family with burdens that can be handled ahead of time.   Advanced Directives: I recommend you consider completing a Health Care Power of Attorney and Living Will.   These documents respect your wishes and help alleviate burdens on your loved ones if you were to become terminally ill or be in a position to need those documents enforced.    You can complete Advanced Directives yourself, have them notarized, then have copies made for our office, for you and for anybody you feel should have them in safe keeping.  Or, you can have an attorney prepare these documents.   If you haven't updated your Last Will and Testament in a while, it may be worthwhile having an attorney prepare these documents together and save on some costs.       Spiritual and Emotional Health Keeping a healthy spiritual life can help you better manage your physical health. Your spiritual life can help you to cope with any issues that may arise with your physical health.  Balance can keep us  healthy and help us  to recover.  If you are struggling with your spiritual health there are questions that you may want to ask yourself:  What makes me feel most complete? When do I feel most connected to the rest of the world? Where do I find the most inner strength? What am I doing when I feel whole?  Helpful tips: Being in nature. Some people feel very connected and at peace when they are walking outdoors or are  outside. Helping others. Some feel the largest sense of wellbeing when they are of service to others. Being of service can take on many forms. It can be doing volunteer work, being kind to strangers, or offering a hand to a friend in need. Gratitude. Some people find they feel the most connected when they remain grateful. They may make lists of all the things they are grateful for or say a thank you out loud for all they have.    Emotional Health Are you in tune with your emotional health?  Check out this link: http://www.marquez-love.com/    Financial Health Make sure you use a budget for your personal finances Make sure you are  insured against risks (health insurance, life insurance, auto insurance, Catering manager) Save more, spend less Set financial goals If you need help in this area, good resources include counseling through [employer] or other community resources, have a meeting with a Social research officer, government, and a good resource is the [employer]    Drequan was seen today for new patient (initial visit).  Diagnoses and all orders for this visit:  Encounter for health maintenance examination in adult -     Comprehensive metabolic panel with GFR -     CBC -     TSH -     Urinalysis, Routine w reflex microscopic -     Hepatitis B surface antibody,quantitative -     Measles/Mumps/Rubella Immunity -     Varicella zoster antibody, IgG  Vaccine counseling  Screening for hematuria or proteinuria -     Urinalysis, Routine w reflex microscopic  Travel advice encounter  Screening for condition -     Hepatitis B surface antibody,quantitative -     Measles/Mumps/Rubella Immunity -     Varicella zoster antibody, IgG  Need for Tdap vaccination -     Tdap vaccine greater than or equal to 7yo IM  Need for hepatitis A immunization -     Hepatitis A vaccine adult IM  Need for malaria prophylaxis  Sebaceous cyst  Tobacco use  Other orders -      atovaquone-proguanil (MALARONE) 250-100 MG TABS tablet; Take 1 tablet by mouth daily. Begin 2 days prior to trip, use daily, continue for 7 days after trip     Follow-up pending labs, yearly for physical

## 2023-09-09 ENCOUNTER — Ambulatory Visit: Payer: Self-pay | Admitting: Medical

## 2023-09-09 LAB — COMPREHENSIVE METABOLIC PANEL WITH GFR
ALT: 18 IU/L (ref 0–44)
AST: 21 IU/L (ref 0–40)
Albumin: 4.3 g/dL (ref 4.1–5.1)
Alkaline Phosphatase: 89 IU/L (ref 44–121)
BUN/Creatinine Ratio: 19 (ref 9–20)
BUN: 18 mg/dL (ref 6–24)
Bilirubin Total: <0.2 mg/dL (ref 0.0–1.2)
CO2: 23 mmol/L (ref 20–29)
Calcium: 9 mg/dL (ref 8.7–10.2)
Chloride: 104 mmol/L (ref 96–106)
Creatinine, Ser: 0.96 mg/dL (ref 0.76–1.27)
Globulin, Total: 2.1 g/dL (ref 1.5–4.5)
Glucose: 92 mg/dL (ref 70–99)
Potassium: 4.1 mmol/L (ref 3.5–5.2)
Sodium: 141 mmol/L (ref 134–144)
Total Protein: 6.4 g/dL (ref 6.0–8.5)
eGFR: 101 mL/min/1.73 (ref >59)

## 2023-09-09 LAB — URINALYSIS, ROUTINE W REFLEX MICROSCOPIC
Bilirubin, UA: NEGATIVE
Glucose, UA: NEGATIVE
Ketones, UA: NEGATIVE
Leukocytes,UA: NEGATIVE
Nitrite, UA: NEGATIVE
Protein,UA: NEGATIVE
RBC, UA: NEGATIVE
Specific Gravity, UA: 1.026 (ref 1.005–1.030)
Urobilinogen, Ur: 1 mg/dL (ref 0.2–1.0)
pH, UA: 6.5 (ref 5.0–7.5)

## 2023-09-09 LAB — CBC
Hematocrit: 42 % (ref 37.5–51.0)
Hemoglobin: 13.6 g/dL (ref 13.0–17.7)
MCH: 30.4 pg (ref 26.6–33.0)
MCHC: 32.4 g/dL (ref 31.5–35.7)
MCV: 94 fL (ref 79–97)
Platelets: 213 x10E3/uL (ref 150–450)
RBC: 4.48 x10E6/uL (ref 4.14–5.80)
RDW: 13.2 % (ref 11.6–15.4)
WBC: 4.5 x10E3/uL (ref 3.4–10.8)

## 2023-09-09 LAB — MEASLES/MUMPS/RUBELLA IMMUNITY
MUMPS ABS, IGG: 133 [AU]/ml (ref >10.9)
RUBEOLA AB, IGG: 33.7 [AU]/ml (ref >16.4)
Rubella Antibodies, IGG: 1.38 {index} (ref >0.99)

## 2023-09-09 LAB — HEPATITIS B SURFACE ANTIBODY, QUANTITATIVE

## 2023-09-09 LAB — TSH: TSH: 1.69 u[IU]/mL (ref 0.450–4.500)

## 2023-09-09 LAB — VARICELLA ZOSTER ANTIBODY, IGG: Varicella zoster IgG: REACTIVE

## 2023-09-09 NOTE — Progress Notes (Signed)
 Results sent through MyChart

## 2023-09-12 ENCOUNTER — Other Ambulatory Visit

## 2023-09-13 ENCOUNTER — Telehealth: Payer: Self-pay | Admitting: *Deleted

## 2023-09-13 ENCOUNTER — Other Ambulatory Visit: Payer: Self-pay | Admitting: Medical

## 2023-09-13 ENCOUNTER — Other Ambulatory Visit (INDEPENDENT_AMBULATORY_CARE_PROVIDER_SITE_OTHER)

## 2023-09-13 DIAGNOSIS — Z23 Encounter for immunization: Secondary | ICD-10-CM | POA: Diagnosis not present

## 2023-09-13 MED ORDER — CIPROFLOXACIN HCL 500 MG PO TABS: 500.0000 mg | ORAL_TABLET | Freq: Two times a day (BID) | ORAL | 0 refills | Status: AC

## 2023-09-13 NOTE — Telephone Encounter (Signed)
 Patient was here for Heplisav #1 and he is traveling to [place] 09/26/23. Asked about an rx for cipro for diarrhea just in case. Uses [facility] [address]

## 2023-10-13 ENCOUNTER — Other Ambulatory Visit

## 2023-10-13 DIAGNOSIS — Z23 Encounter for immunization: Secondary | ICD-10-CM

## 2024-02-28 ENCOUNTER — Encounter: Payer: Self-pay | Admitting: Medical

## 2024-03-19 ENCOUNTER — Ambulatory Visit: Admitting: Medical

## 2024-03-30 ENCOUNTER — Ambulatory Visit (INDEPENDENT_AMBULATORY_CARE_PROVIDER_SITE_OTHER): Admitting: Medical

## 2024-03-30 VITALS — BP 94/62 | HR 85 | Wt 174.4 lb

## 2024-03-30 DIAGNOSIS — Z23 Encounter for immunization: Secondary | ICD-10-CM

## 2024-03-30 DIAGNOSIS — Z7185 Encounter for immunization safety counseling: Secondary | ICD-10-CM

## 2024-03-30 NOTE — Progress Notes (Signed)
" °  Subjective:     Patient ID: AAYDEN CEFALU, male   DOB: [birth year], 43 y.o.   MRN: [id number]  HPI Chief Complaint  Patient presents with   Follow-up    6 month follow-up. Not sure what he is following up. Had hep B series already. Declines all other shots today   Here for follow-up.  After reviewing the chart he is actually only here for a vaccine follow-up.  Review of Systems As in subjective    Objective:   Physical Exam BP 94/62   Pulse 85   Wt 174 lb 6.4 oz (79.1 kg)   SpO2 98%   BMI 25.75 kg/m      Assessment:     Encounter Diagnoses  Name Primary?   Vaccine counseling Yes   Need for hepatitis A immunization        Plan:     Counseled on the Hepatitis A virus vaccine.  Vaccination administered   Fatih was seen today for follow-up.  Diagnoses and all orders for this visit:  Vaccine counseling -     Hepatitis A vaccine adult IM  Need for hepatitis A immunization  F/u May 2026 for physical  "

## 2024-09-12 ENCOUNTER — Encounter: Payer: Self-pay | Admitting: Medical
